# Patient Record
Sex: Male | Born: 1990 | Hispanic: Yes | Marital: Single | Smoking: Never smoker
Health system: Southern US, Community
[De-identification: ages and names within clinical notes are randomized; demographics above are authoritative.]

---

## 2020-07-14 ENCOUNTER — Emergency Department
Admission: EM | Admit: 2020-07-14 | Discharge: 2020-07-14 | Disposition: A | Discharge status: Home / Self Care | Payer: No Typology Code available for payment source | Attending: Emergency Medicine | Admitting: Emergency Medicine

## 2020-07-14 ENCOUNTER — Other Ambulatory Visit: Payer: Self-pay

## 2020-07-14 ENCOUNTER — Emergency Department: Payer: No Typology Code available for payment source

## 2020-07-14 ENCOUNTER — Encounter: Payer: Self-pay | Admitting: Emergency Medicine

## 2020-07-14 DIAGNOSIS — Y939 Activity, unspecified: Secondary | ICD-10-CM | POA: Diagnosis not present

## 2020-07-14 DIAGNOSIS — W268XXA Contact with other sharp object(s), not elsewhere classified, initial encounter: Secondary | ICD-10-CM | POA: Insufficient documentation

## 2020-07-14 DIAGNOSIS — Z23 Encounter for immunization: Secondary | ICD-10-CM | POA: Insufficient documentation

## 2020-07-14 DIAGNOSIS — Y929 Unspecified place or not applicable: Secondary | ICD-10-CM | POA: Diagnosis not present

## 2020-07-14 DIAGNOSIS — Y999 Unspecified external cause status: Secondary | ICD-10-CM | POA: Insufficient documentation

## 2020-07-14 DIAGNOSIS — S71111A Laceration without foreign body, right thigh, initial encounter: Secondary | ICD-10-CM | POA: Insufficient documentation

## 2020-07-14 DIAGNOSIS — S81811A Laceration without foreign body, right lower leg, initial encounter: Secondary | ICD-10-CM

## 2020-07-14 MED ORDER — DOXYCYCLINE MONOHYDRATE 100 MG PO TABS: 100.0000 mg | ORAL_TABLET | Freq: Two times a day (BID) | ORAL | 0 refills | Status: AC

## 2020-07-14 MED ORDER — CEPHALEXIN 500 MG PO CAPS
500.0000 mg | ORAL_CAPSULE | Freq: Once | ORAL | Status: AC
  Administered 2020-07-14: 500 mg via ORAL
  Filled 2020-07-14: qty 1

## 2020-07-14 MED ORDER — TETANUS-DIPHTH-ACELL PERTUSSIS 5-2.5-18.5 LF-MCG/0.5 IM SUSP
0.5000 mL | Freq: Once | INTRAMUSCULAR | Status: AC
  Administered 2020-07-14: 0.5 mL via INTRAMUSCULAR
  Filled 2020-07-14: qty 0.5

## 2020-07-14 MED ORDER — LIDOCAINE-EPINEPHRINE (PF) 2 %-1:200000 IJ SOLN
20.0000 mL | Freq: Once | INTRAMUSCULAR | Status: AC
  Administered 2020-07-14: 20 mL via INTRADERMAL
  Filled 2020-07-14: qty 20

## 2020-07-14 MED ORDER — DOXYCYCLINE MONOHYDRATE 100 MG PO TABS: 100.0000 mg | ORAL_TABLET | Freq: Two times a day (BID) | ORAL | 0 refills | Status: DC

## 2020-07-14 MED ORDER — ACETAMINOPHEN 500 MG PO TABS
1000.0000 mg | ORAL_TABLET | Freq: Once | ORAL | Status: AC
  Administered 2020-07-14: 1000 mg via ORAL
  Filled 2020-07-14: qty 2

## 2020-07-14 MED ORDER — NAPROXEN 500 MG PO TABS
500.0000 mg | ORAL_TABLET | Freq: Once | ORAL | Status: AC
  Administered 2020-07-14: 500 mg via ORAL
  Filled 2020-07-14: qty 1

## 2020-07-14 NOTE — ED Triage Notes (Signed)
Presents with laceration to leg    W/c injury

## 2020-07-14 NOTE — ED Provider Notes (Signed)
[facility] Emergency Department Provider Note  ____________________________________________   First MD Initiated Contact with Patient 07/14/20 1215     (approximate)  I have reviewed the triage vital signs and the nursing notes.   HISTORY  Chief Complaint Laceration   HPI Casey Dunn is a 29 y.o. male without significant past medical history presents for assessment of 2 lacerations sustained to the upper right leg at his place of work.  Patient states he had his legs placed by 2 metal disks.  He stabs only injuries in his right thigh and denies any injuries throughout his other extremities or elsewhere.  No prior similar episodes or other recent injuries.  Patient received some pain but no clear alleviating or aggravating factors.  Patient does not recall his last tetanus shot.  States he is otherwise been his usual state of health without any recent fevers, chills, cough, nausea, vomiting, diarrhea, dysuria, chest pain, rash, extremity pain, or other acute complaints.  No medications prior to arrival.         History reviewed. No pertinent past medical history.  There are no problems to display for this patient.   History reviewed. No pertinent surgical history.  Prior to Admission medications   Medication Sig Start Date End Date Taking? Authorizing Provider  doxycycline (ADOXA) 100 MG tablet Take 1 tablet (100 mg total) by mouth 2 (two) times daily for 10 days. 07/14/20 07/24/20  Gilles Chiquito, MD    Allergies Patient has no known allergies.  No family history on file.  Social History Social History   Tobacco Use  . Smoking status: Never Smoker  . Smokeless tobacco: Never Used  Substance Use Topics  . Alcohol use: Never  . Drug use: Not on file    Review of Systems  Review of Systems  Constitutional: Negative for chills and fever.  HENT: Negative for sore throat.   Eyes: Negative for pain.  Respiratory: Negative for cough and  stridor.   Cardiovascular: Negative for chest pain.  Gastrointestinal: Negative for vomiting.  Musculoskeletal: Positive for myalgias (  R leg).  Skin: Negative for rash.  Neurological: Negative for seizures, loss of consciousness and headaches.  Psychiatric/Behavioral: Negative for suicidal ideas.  All other systems reviewed and are negative.     ____________________________________________   PHYSICAL EXAM:  VITAL SIGNS: ED Triage Vitals  Enc Vitals Group     BP      Pulse      Resp      Temp      Temp src      SpO2      Weight      Height      Head Circumference      Peak Flow      Pain Score      Pain Loc      Pain Edu?      Excl. in GC?    Vitals:   07/14/20 1219  BP: 127/78  Pulse: 88  Resp: 18  Temp: 98 F (36.7 C)  SpO2: 98%   Physical Exam Vitals and nursing note reviewed.  Constitutional:      Appearance: He is well-developed.  HENT:     Head: Normocephalic and atraumatic.     Right Ear: External ear normal.     Left Ear: External ear normal.     Nose: Nose normal.  Eyes:     Conjunctiva/sclera: Conjunctivae normal.  Cardiovascular:     Rate and Rhythm: Normal  rate and regular rhythm.     Heart sounds: No murmur heard.   Pulmonary:     Effort: Pulmonary effort is normal. No respiratory distress.     Breath sounds: Normal breath sounds.  Abdominal:     Palpations: Abdomen is soft.     Tenderness: There is no abdominal tenderness.  Musculoskeletal:     Cervical back: Neck supple.  Skin:    General: Skin is warm and dry.  Neurological:     Mental Status: He is alert and oriented to person, place, and time.  Psychiatric:        Mood and Affect: Mood normal.     Patient has 2 linear deep hemostatic lacerations over the lateral aspect of the mid right thigh.  These lacerations are parallel and each approximately 3 cm in length. ____________________________________________  ____________________________________________  RADIOLOGY  ED  MD interpretation: No radiopaque foreign bodies.  Official radiology report(s): DG Femur Min 2 Views Right  Result Date: 07/14/2020 CLINICAL DATA:  Lateral thigh laceration today. EXAM: RIGHT FEMUR 2 VIEWS COMPARISON:  None. FINDINGS: There is a large soft tissue defect laterally in the distal thigh. No foreign body, acute fracture or dislocation is identified. The hip and knee joint spaces are preserved. IMPRESSION: Large soft tissue injury laterally in the distal thigh. No acute osseous findings or foreign body. Electronically Signed   By: Carey Bullocks M.D.   On: 07/14/2020 12:54    ____________________________________________   PROCEDURES  Procedure(s) performed (including Critical Care):  Marland KitchenMarland KitchenLaceration Repair  Date/Time: 07/14/2020 5:24 PM Performed by: Gilles Chiquito, MD Authorized by: Gilles Chiquito, MD   Consent:    Consent obtained:  Verbal   Consent given by:  Patient   Risks discussed:  Pain, infection, need for additional repair and poor cosmetic result   Alternatives discussed:  Delayed treatment and no treatment Anesthesia (see MAR for exact dosages):    Anesthesia method:  Local infiltration   Local anesthetic:  Lidocaine 2% WITH epi Laceration details:    Location:  Leg   Leg location:  R upper leg   Length (cm):  3 Repair type:    Repair type:  Simple Pre-procedure details:    Preparation:  Imaging obtained to evaluate for foreign bodies Exploration:    Hemostasis achieved with:  Direct pressure   Wound exploration: wound explored through full range of motion     Wound extent: fascia violated     Contaminated: no   Treatment:    Area cleansed with:  Saline   Amount of cleaning:  Standard   Irrigation solution:  Sterile saline   Irrigation method:  Syringe   Visualized foreign bodies/material removed: no   Skin repair:    Repair method:  Sutures   Suture size:  4-0   Suture material:  Prolene   Number of sutures:  5 Approximation:     Approximation:  Close Post-procedure details:    Patient tolerance of procedure:  Tolerated well, no immediate complications .Marland KitchenLaceration Repair  Date/Time: 07/14/2020 5:25 PM Performed by: Gilles Chiquito, MD Authorized by: Gilles Chiquito, MD   Consent:    Consent obtained:  Verbal   Consent given by:  Patient   Risks discussed:  Pain, infection, need for additional repair, poor cosmetic result, retained foreign body and poor wound healing   Alternatives discussed:  No treatment and delayed treatment Anesthesia (see MAR for exact dosages):    Anesthesia method:  Local infiltration   Local anesthetic:  Lidocaine 2% WITH epi Laceration details:    Location:  Leg   Leg location:  R upper leg   Length (cm):  3 Repair type:    Repair type:  Simple Exploration:    Hemostasis achieved with:  Direct pressure   Wound exploration: wound explored through full range of motion     Contaminated: no   Treatment:    Area cleansed with:  Saline   Amount of cleaning:  Standard   Irrigation solution:  Sterile saline Skin repair:    Repair method:  Sutures   Suture size:  4-0   Suture technique:  Simple interrupted   Number of sutures:  8 Approximation:    Approximation:  Close Post-procedure details:    Patient tolerance of procedure:  Tolerated well, no immediate complications     ____________________________________________   INITIAL IMPRESSION / ASSESSMENT AND PLAN / ED COURSE        Patient presents with above-stated history exam for assessment of 2 lacerations as described above he sustained while at work.  Afebrile hemodynamically stable on exam.  Exam as above without any evidence of distal neurovascular deficit or other traumatic injury to the right lower extremity.  No underlying fracture or retained opaque foreign body.  Lacerations repaired per procedure note as above.  Patient given below noted medications and discharged in stable condition with instructions to  follow-up in 7 to 10 days with his PCP for suture removal and wound check.  Medications  Tdap (BOOSTRIX) injection 0.5 mL (0.5 mLs Intramuscular Given 07/14/20 1251)  lidocaine-EPINEPHrine (XYLOCAINE W/EPI) 2 %-1:200000 (PF) injection 20 mL (20 mLs Intradermal Given 07/14/20 1254)  acetaminophen (TYLENOL) tablet 1,000 mg (1,000 mg Oral Given 07/14/20 1249)  naproxen (NAPROSYN) tablet 500 mg (500 mg Oral Given 07/14/20 1248)  cephALEXin (KEFLEX) capsule 500 mg (500 mg Oral Given 07/14/20 1249)             ____________________________________________   FINAL CLINICAL IMPRESSION(S) / ED DIAGNOSES  Final diagnoses:  Laceration of right lower extremity, initial encounter     ED Discharge Orders         Ordered    doxycycline (ADOXA) 100 MG tablet  2 times daily,   Status:  Discontinued     Reprint     07/14/20 1327    doxycycline (ADOXA) 100 MG tablet  2 times daily     Discontinue  Reprint     07/14/20 1426           Note:  This document was prepared using Dragon voice recognition software and may include unintentional dictation errors.   Gilles Chiquito, MD 07/14/20 [phone]

## 2020-07-14 NOTE — ED Notes (Signed)
Presents with laceration to right leg  Think he may have cut it on a piece of metal  Bleeding controlled at present

## 2021-05-05 ENCOUNTER — Ambulatory Visit
Admission: RE | Admit: 2021-05-05 | Discharge: 2021-05-05 | Disposition: A | Discharge status: Home / Self Care | Payer: Self-pay | Source: Ambulatory Visit | Attending: Physician Assistant | Admitting: Physician Assistant

## 2021-05-05 ENCOUNTER — Ambulatory Visit
Admission: RE | Admit: 2021-05-05 | Discharge: 2021-05-05 | Disposition: A | Discharge status: Home / Self Care | Payer: Worker's Compensation | Source: Ambulatory Visit | Attending: Physician Assistant | Admitting: Physician Assistant

## 2021-05-05 ENCOUNTER — Other Ambulatory Visit: Payer: Self-pay | Admitting: Physician Assistant

## 2021-05-05 DIAGNOSIS — S6992XA Unspecified injury of left wrist, hand and finger(s), initial encounter: Secondary | ICD-10-CM | POA: Insufficient documentation

## 2021-10-27 IMAGING — CR DG FINGER LITTLE 2+V*L*
1 series · 3 of 3 positions shown · non-contrast
Comparison: No recent prior.

CLINICAL DATA: Finger injury.

EXAM:
LEFT LITTLE FINGER 2+V

[Series 1: dg finger little left · 0.14mm/px · 3 of 3 slices shown]
[im 1/3]
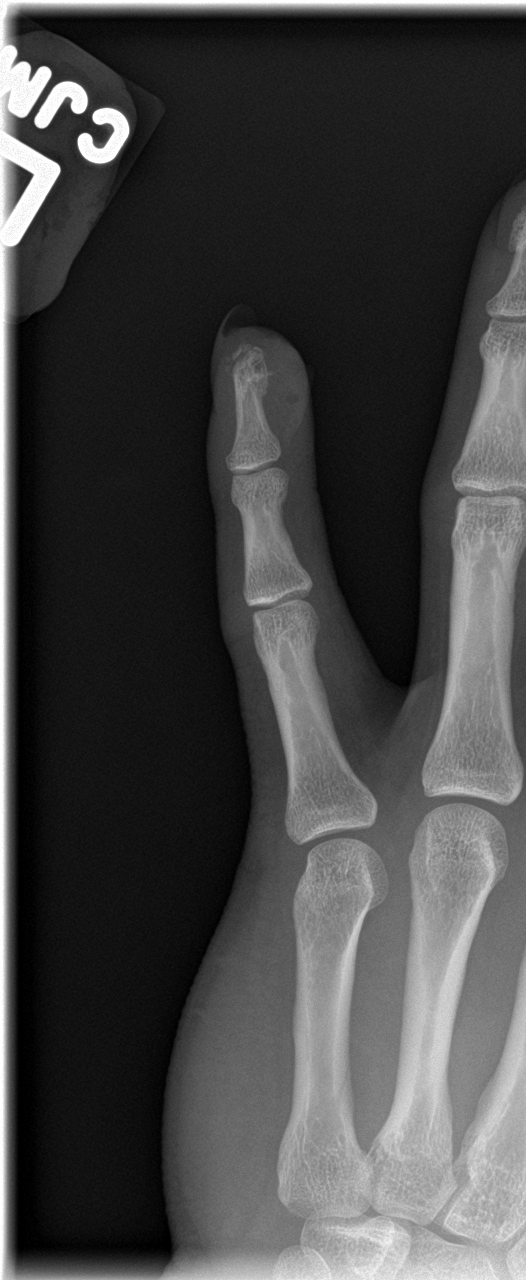
[im 2/3]
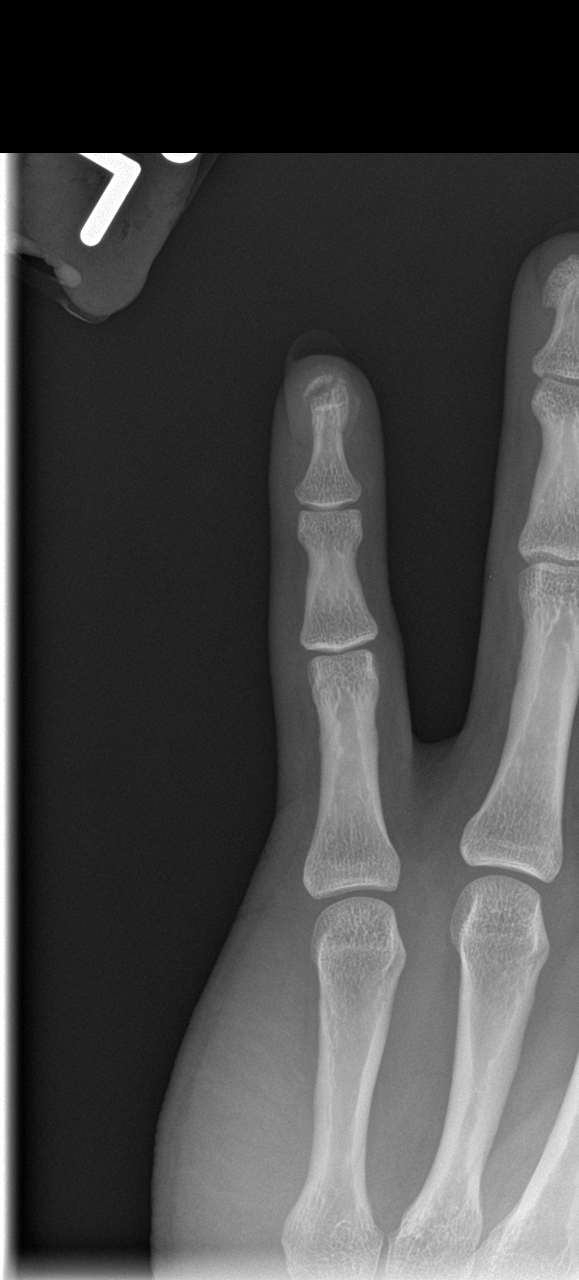
[im 3/3]
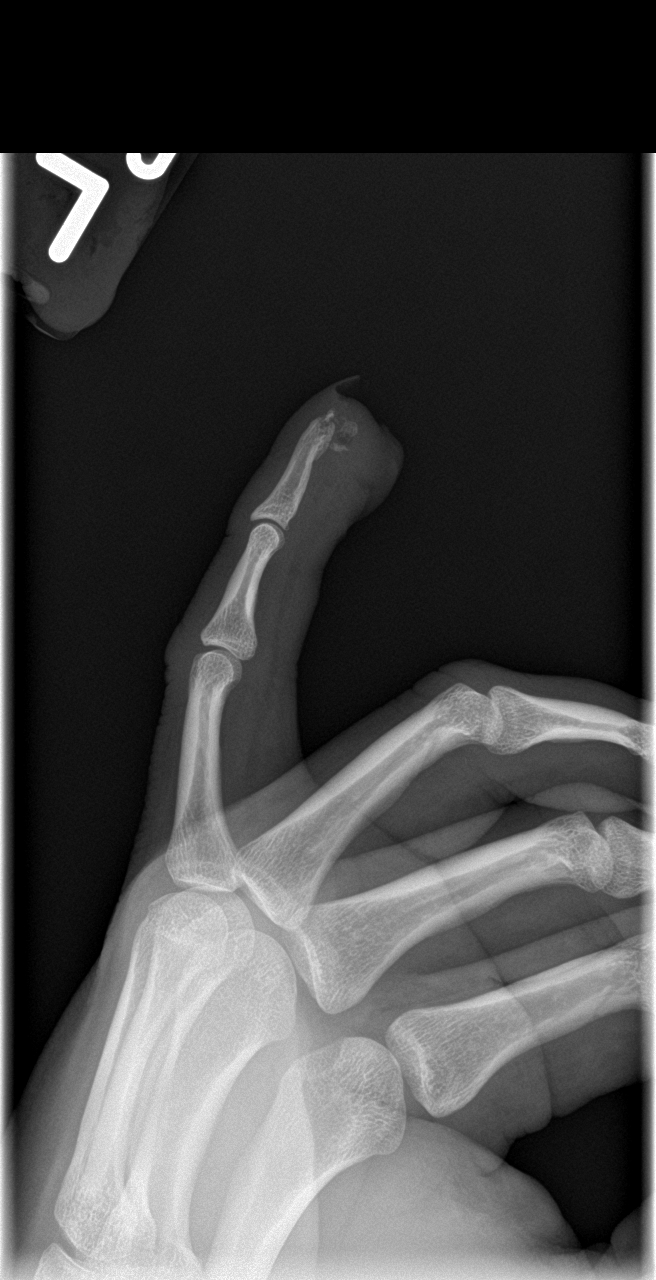

[3 of 3 positions shown; findings below may reference images not displayed]

FINDINGS: Open soft tissue injury with associated displaced comminuted
fracture of the distal tuft of the left fifth digit noted. No
radiopaque foreign body.
IMPRESSION: Open soft tissue air associated displaced comminuted fracture of the
distal tuft of the left fifth digit.

## 2023-08-04 LAB — LAB REPORT - SCANNED
A1c: 5.7
EGFR (Non-African Amer.): 85

## 2023-08-17 DIAGNOSIS — H6122 Impacted cerumen, left ear: Secondary | ICD-10-CM | POA: Diagnosis not present

## 2023-08-22 DIAGNOSIS — H66002 Acute suppurative otitis media without spontaneous rupture of ear drum, left ear: Secondary | ICD-10-CM | POA: Diagnosis not present

## 2023-09-11 DIAGNOSIS — M940 Chondrocostal junction syndrome [Tietze]: Secondary | ICD-10-CM | POA: Diagnosis not present

## 2023-09-16 ENCOUNTER — Ambulatory Visit: Payer: Self-pay | Admitting: Family Medicine

## 2023-10-13 ENCOUNTER — Ambulatory Visit: Payer: Self-pay | Admitting: Family Medicine

## 2024-04-19 ENCOUNTER — Ambulatory Visit: Admitting: Student in an Organized Health Care Education/Training Program

## 2024-04-19 ENCOUNTER — Encounter: Payer: Self-pay | Admitting: Student in an Organized Health Care Education/Training Program

## 2024-04-19 ENCOUNTER — Other Ambulatory Visit (HOSPITAL_COMMUNITY)
Admission: RE | Admit: 2024-04-19 | Discharge: 2024-04-19 | Disposition: A | Discharge status: Home / Self Care | Source: Ambulatory Visit | Attending: Student in an Organized Health Care Education/Training Program | Admitting: Student in an Organized Health Care Education/Training Program

## 2024-04-19 VITALS — BP 143/77 | HR 106 | Ht 66.14 in | Wt 200.0 lb

## 2024-04-19 DIAGNOSIS — R7303 Prediabetes: Secondary | ICD-10-CM

## 2024-04-19 DIAGNOSIS — Z113 Encounter for screening for infections with a predominantly sexual mode of transmission: Secondary | ICD-10-CM | POA: Insufficient documentation

## 2024-04-19 DIAGNOSIS — E66811 Obesity, class 1: Secondary | ICD-10-CM

## 2024-04-19 DIAGNOSIS — Z1159 Encounter for screening for other viral diseases: Secondary | ICD-10-CM | POA: Diagnosis not present

## 2024-04-19 DIAGNOSIS — I10 Essential (primary) hypertension: Secondary | ICD-10-CM

## 2024-04-19 NOTE — Progress Notes (Addendum)
 New Patient Visit  Patient: Casey Dunn   DOB: Jul 02, 1991   32 y.o. Male  MRN: [id number]  Subjective:     Chief Complaint  Patient presents with   Establish Care    No concerns     Casey Dunn is a 33 y.o. male who presents today for a hypertension. He reports consuming a general diet. Gym/ health club routine includes cardio and mod to heavy weightlifting. He generally feels well. He reports sleeping well. He does not have additional problems to discuss today.   The patient lives in [place], lives with his parents, works in a Set designer job.  No tobacco use.  No concerns about alcohol use.  Denies any recent fevers or chills.  No chest pain or shortness of breath.  He is reported some recent weight gain, weight got up to as much is 208 pounds.  He is able to lose about 8 pounds over the last 2 months with dietary changes.  He was to start going to the gym more.   Most recent fall risk assessment:    04/19/2024    3:46 PM  Fall Risk   Falls in the past year? 0  Number falls in past yr: 0  Injury with Fall? 0  Risk for fall due to : No Fall Risks  Follow up Falls evaluation completed     Most recent depression screenings:    04/19/2024    3:46 PM  PHQ 2/9 Scores  PHQ - 2 Score 0  PHQ- 9 Score 0    Patient Care Team: Ether Hercules, MD as PCP - General (Internal Medicine)       Objective:     BP (!) 143/77   Pulse (!) 106   Ht 5' 6.14" (1.68 m)   Wt 200 lb (90.7 kg)   SpO2 98%   BMI 32.14 kg/m    Physical Exam   Gen: Well-appearing man Eyes: Normal Ears: Normal bilateral tympanic membranes Neck: Normal thyroid, no nodules or adenopathy Heart: Regular, no murmur Lungs: Unlabored, clear throughout Abd: Soft, nontender, no organomegaly Skin: No pigmented lesions, no striae, no rash Ext: Warm, no edema, normal joints Neuro: Alert, conversational, full strength upper and lower extremities, normal gait, normal balance Psych: Appropriate mood  and affect, not anxious or depressed appearing      Assessment & Plan:    Routine Health Maintenance and Physical Exam  Immunization History  Administered Date(s) Administered   Tdap 07/14/2020    Health Maintenance  Topic Date Due   HIV Screening  Never done   Hepatitis C Screening  Never done   COVID-19 Vaccine (1 - 2024-25 season) Never done   INFLUENZA VACCINE  07/06/2024   DTaP/Tdap/Td (2 - Td or Tdap) 07/14/2030   HPV VACCINES  Aged Out   Meningococcal B Vaccine  Aged Out    Discussed health benefits of physical activity, and encouraged him to engage in regular exercise appropriate for his age and condition.  Problem List Items Addressed This Visit       Medium    Obesity (BMI 30.0-34.9) - Primary (Chronic)   Chronic and stable.  Weight today 200 pounds with a BMI of 32.  No early evidence of metabolic syndrome as she has hyperglycemia and early hypertension.  We talked about lifestyle modifications to treat obesity.  We talked about nutrition changes, I recommended a calorie deficits.  We talked about exercise routines that can be helpful.  We decided not to use  medication assisted treatment right now.  Will check labs today to look for signs of metabolic syndrome.  He had normal lipids last year.      Relevant Orders   Comprehensive metabolic panel with GFR   Hemoglobin A1c   Hypertension (Chronic)   Blood pressure elevated today.  I do not have any prior readings to know if this is a chronic issue.  It is associated with obesity, prediabetes, some seems like this is part of a developing metabolic syndrome.  Given his young age, he is at low risk right now.  I think this is stage I hypertension.  For now I recommended lifestyle interventions.  We talked about nutrition changes, importance of weight loss, increasing exercise.  Will check labs, if you develop something like diabetes that would indicate a need to start antihypertensive medications sooner.  For now would  like to try lifestyle modifications, would like to see the patient back in 6-12 months to recheck blood pressure.        Low   Prediabetes (Chronic)   Chronic issue.  History of an A1c of 5.7% in 2024.  This is associated with obesity, and some early hypertension.  Will recheck A1c.      Relevant Orders   Hemoglobin A1c   Other Visit Diagnoses       Encounter for HCV screening test for low risk patient       Relevant Orders   Hepatitis C antibody     Screening examination for STI       Relevant Orders   HIV Antibody (routine testing w rflx)   RPR   Urine cytology ancillary only      Return in about 1 year (around 04/19/2025).     Ether Hercules, MD

## 2024-04-19 NOTE — Assessment & Plan Note (Signed)
 Blood pressure elevated today.  I do not have any prior readings to know if this is a chronic issue.  It is associated with obesity, prediabetes, some seems like this is part of a developing metabolic syndrome.  Given his young age, he is at low risk right now.  I think this is stage I hypertension.  For now I recommended lifestyle interventions.  We talked about nutrition changes, importance of weight loss, increasing exercise.  Will check labs, if you develop something like diabetes that would indicate a need to start antihypertensive medications sooner.  For now would like to try lifestyle modifications, would like to see the patient back in 6-12 months to recheck blood pressure.

## 2024-04-19 NOTE — Assessment & Plan Note (Signed)
 Chronic and stable.  Weight today 200 pounds with a BMI of 32.  No early evidence of metabolic syndrome as she has hyperglycemia and early hypertension.  We talked about lifestyle modifications to treat obesity.  We talked about nutrition changes, I recommended a calorie deficits.  We talked about exercise routines that can be helpful.  We decided not to use medication assisted treatment right now.  Will check labs today to look for signs of metabolic syndrome.  He had normal lipids last year.

## 2024-04-19 NOTE — Assessment & Plan Note (Signed)
 Chronic issue.  History of an A1c of 5.7% in 2024.  This is associated with obesity, and some early hypertension.  Will recheck A1c.

## 2024-04-20 LAB — COMPREHENSIVE METABOLIC PANEL WITH GFR
ALT: 33 U/L (ref 0–53)
AST: 25 U/L (ref 0–37)
Albumin: 5 g/dL (ref 3.5–5.2)
Alkaline Phosphatase: 61 U/L (ref 39–117)
BUN: 17 mg/dL (ref 6–23)
CO2: 28 meq/L (ref 19–32)
Calcium: 9.4 mg/dL (ref 8.4–10.5)
Chloride: 101 meq/L (ref 96–112)
Creatinine, Ser: 1.24 mg/dL (ref 0.40–1.50)
GFR: 77.23 mL/min (ref >60.00)
Glucose, Bld: 121 mg/dL — ABNORMAL HIGH (ref 70–99)
Potassium: 3.7 meq/L (ref 3.5–5.1)
Sodium: 138 meq/L (ref 135–145)
Total Bilirubin: 0.9 mg/dL (ref 0.2–1.2)
Total Protein: 7.9 g/dL (ref 6.0–8.3)

## 2024-04-20 LAB — HEMOGLOBIN A1C: Hgb A1c MFr Bld: 5.5 % (ref 4.6–6.5)

## 2024-04-21 LAB — HEPATITIS C ANTIBODY: Hepatitis C Ab: NONREACTIVE

## 2024-04-21 LAB — RPR: RPR Ser Ql: NONREACTIVE

## 2024-04-21 LAB — HIV ANTIBODY (ROUTINE TESTING W REFLEX): HIV 1&2 Ab, 4th Generation: NONREACTIVE

## 2024-04-23 ENCOUNTER — Ambulatory Visit: Payer: Self-pay | Admitting: Student in an Organized Health Care Education/Training Program

## 2024-04-23 LAB — URINE CYTOLOGY ANCILLARY ONLY
Chlamydia: NEGATIVE
Comment: NEGATIVE
Comment: NORMAL
Neisseria Gonorrhea: NEGATIVE

## 2024-04-26 NOTE — Addendum Note (Signed)
 Addended by: Juel Nutley T on: 04/26/2024 12:50 PM   Modules accepted: Level of Service

## 2024-05-10 ENCOUNTER — Telehealth: Payer: Self-pay

## 2024-05-10 NOTE — Telephone Encounter (Signed)
 Copied from CRM [id number]. Topic: Clinical - Lab/Test Results >> May 10, 2024  4:41 PM Jim Motts C wrote: Reason for CRM: Patient stated that he had labs done last month and did not receive a call back for results. Patient contact is [phone].

## 2024-05-10 NOTE — Telephone Encounter (Signed)
 Patient states he had labs completed on 04/23/2024 and never received lab results. Can we result this for him.

## 2024-05-11 NOTE — Telephone Encounter (Signed)
 Left message to call office

## 2024-05-11 NOTE — Telephone Encounter (Signed)
 Patient called back and was informed.

## 2024-05-11 NOTE — Telephone Encounter (Signed)
 I sent a letter to the patient with his lab results on 5/19.  You can give him the information from that letter as well as resend it for him if he did not receive it.

## 2024-05-14 ENCOUNTER — Ambulatory Visit (INDEPENDENT_AMBULATORY_CARE_PROVIDER_SITE_OTHER): Admitting: Student in an Organized Health Care Education/Training Program

## 2024-05-14 VITALS — BP 145/75 | HR 86 | Wt 203.0 lb

## 2024-05-14 DIAGNOSIS — I10 Essential (primary) hypertension: Secondary | ICD-10-CM

## 2024-05-14 NOTE — Progress Notes (Signed)
 Brief office visit, seemed to be made in error.  Patient just wanted to go over blood work results again.  We talked about these.  Nothing else that needed follow-up.  No charge visit.

## 2024-05-22 DIAGNOSIS — R220 Localized swelling, mass and lump, head: Secondary | ICD-10-CM | POA: Diagnosis not present

## 2024-08-09 ENCOUNTER — Telehealth: Payer: Self-pay

## 2024-08-09 NOTE — Telephone Encounter (Signed)
 Patient was directed to call billing department.

## 2024-08-09 NOTE — Telephone Encounter (Signed)
 Copied from CRM #[id number]. Topic: General - Billing Inquiry >> Aug 08, 2024  4:35 PM [place]ique E wrote: Reason for CRM: Patient stated he received a message about a balance of $25 from his visit on 6/9, patient stated that he already paid this so he is questioning why it is still on his chart. Relayed billing number to patient and he stated he will follow up with them as well.

## 2024-08-15 NOTE — Telephone Encounter (Signed)
 Copied from CRM [id number]. Topic: General - Billing Inquiry >> Aug 15, 2024  3:44 PM Burnard DEL wrote:   Reason for CRM: Patient called in stating that he has a 25 dollar balance on his account.He stated that when he came in to see Dr Jerrell on 05/14/2024 the provider told him that he did not have to pay his copay because he only had a question regarding his previous appointment from when he seen him on 05/14/2024. Patient would like to have this looked into.   Patient completed an office visit on 6/9 so I don't think this copay can be refunded

## 2024-08-24 DIAGNOSIS — S39012A Strain of muscle, fascia and tendon of lower back, initial encounter: Secondary | ICD-10-CM | POA: Diagnosis not present

## 2025-04-22 ENCOUNTER — Encounter: Admitting: Student in an Organized Health Care Education/Training Program
